# Patient Record
Sex: Female | Born: 1950 | Race: White | Hispanic: No | Marital: Married | Smoking: Current every day smoker
Health system: Southern US, Community
[De-identification: ages and names within clinical notes are randomized; demographics above are authoritative.]

## PROBLEM LIST (undated history)

## (undated) DIAGNOSIS — M1990 Unspecified osteoarthritis, unspecified site: Secondary | ICD-10-CM

## (undated) DIAGNOSIS — C801 Malignant (primary) neoplasm, unspecified: Secondary | ICD-10-CM

## (undated) DIAGNOSIS — R05 Cough: Secondary | ICD-10-CM

## (undated) DIAGNOSIS — R059 Cough, unspecified: Secondary | ICD-10-CM

## (undated) HISTORY — PX: EYE SURGERY: SHX253

## (undated) HISTORY — PX: APPENDECTOMY: SHX54

## (undated) HISTORY — PX: TONSILLECTOMY: SUR1361

## (undated) HISTORY — PX: OTHER SURGICAL HISTORY: SHX169

---

## 2000-07-08 ENCOUNTER — Other Ambulatory Visit: Admission: RE | Admit: 2000-07-08 | Discharge: 2000-07-08 | Payer: Self-pay | Admitting: Gynecology

## 2002-03-23 ENCOUNTER — Other Ambulatory Visit: Admission: RE | Admit: 2002-03-23 | Discharge: 2002-03-23 | Payer: Self-pay | Admitting: Gynecology

## 2003-05-17 ENCOUNTER — Other Ambulatory Visit: Admission: RE | Admit: 2003-05-17 | Discharge: 2003-05-17 | Payer: Self-pay | Admitting: Gynecology

## 2004-06-22 ENCOUNTER — Other Ambulatory Visit: Admission: RE | Admit: 2004-06-22 | Discharge: 2004-06-22 | Payer: Self-pay | Admitting: Gynecology

## 2004-07-25 IMAGING — CR DG CHEST 2V
2 series · 2 of 2 positions shown · non-contrast
Comparison: none

CLINICAL DATA: Post-menopausal bleeding.  Pre-op respiratory exam. 

 CHEST (TWO VIEWS):
 The heart size and mediastinal contours are normal. The lungs are clear. The visualized skeleton is unremarkable.

[view not recorded (1 of 2)]
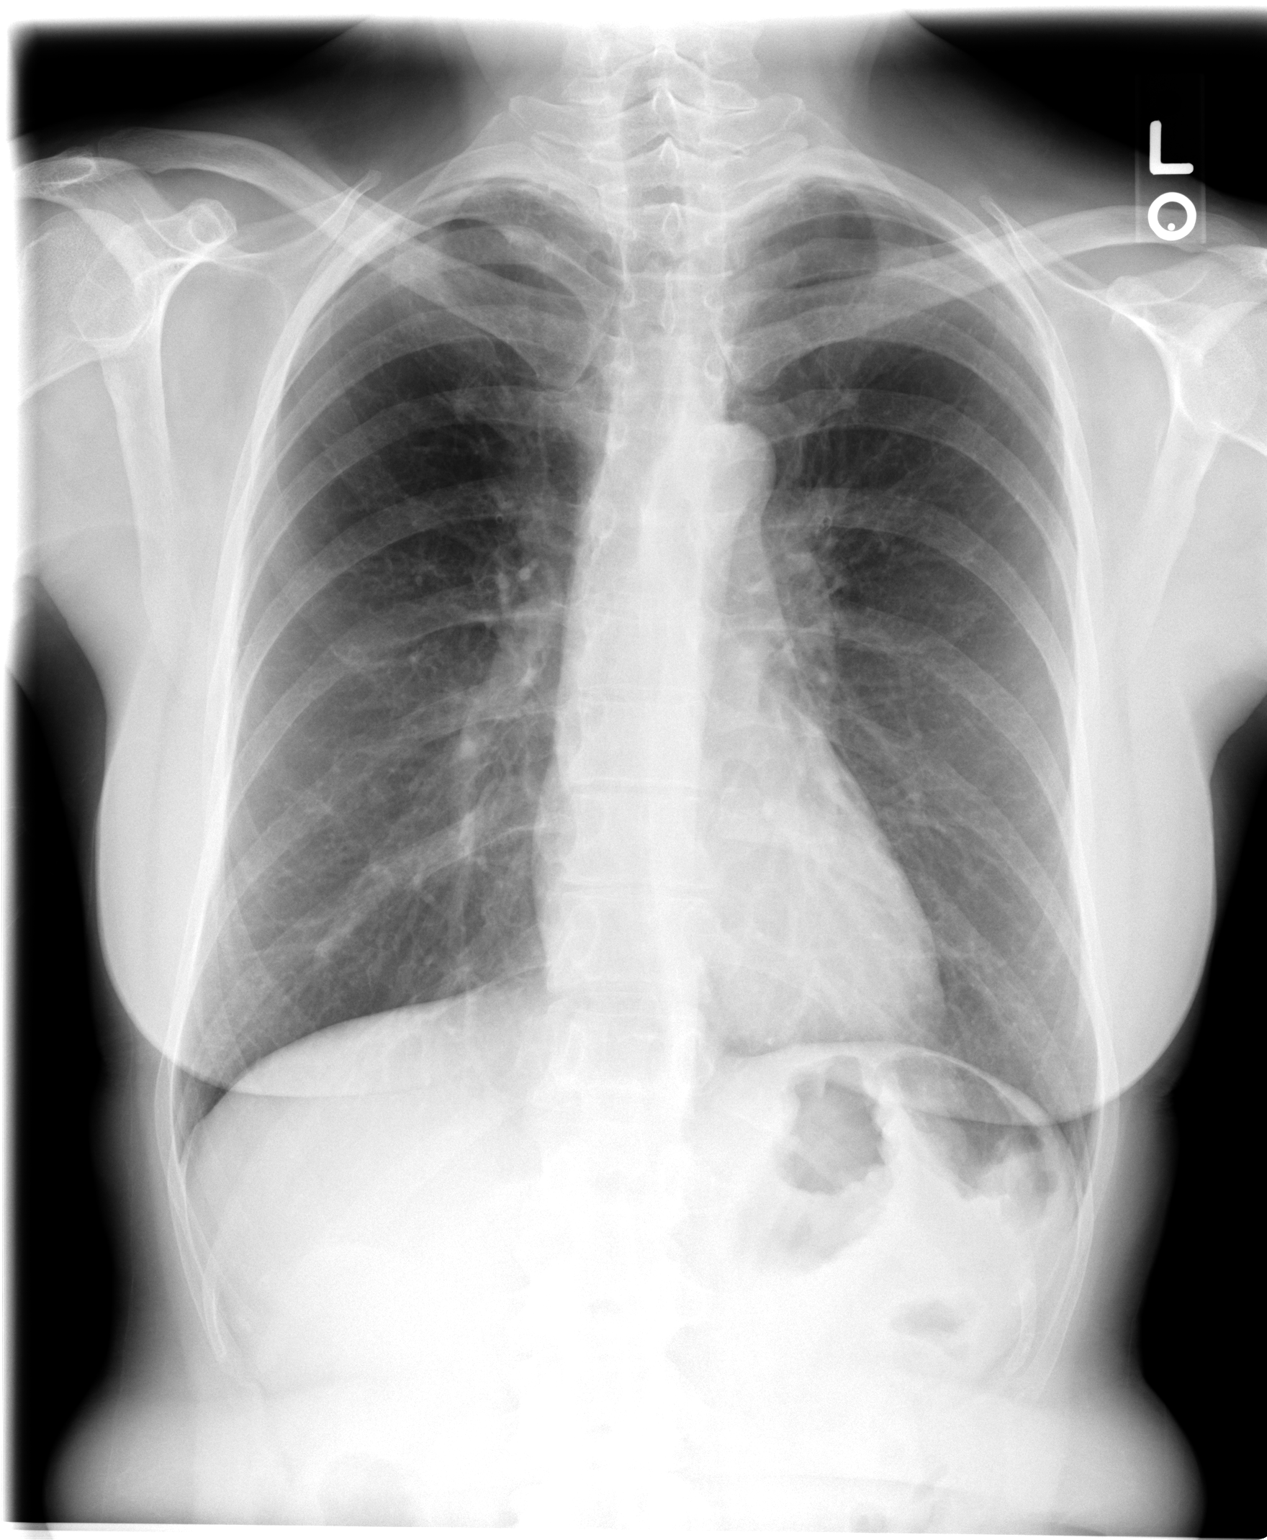

[view not recorded (2 of 2)]
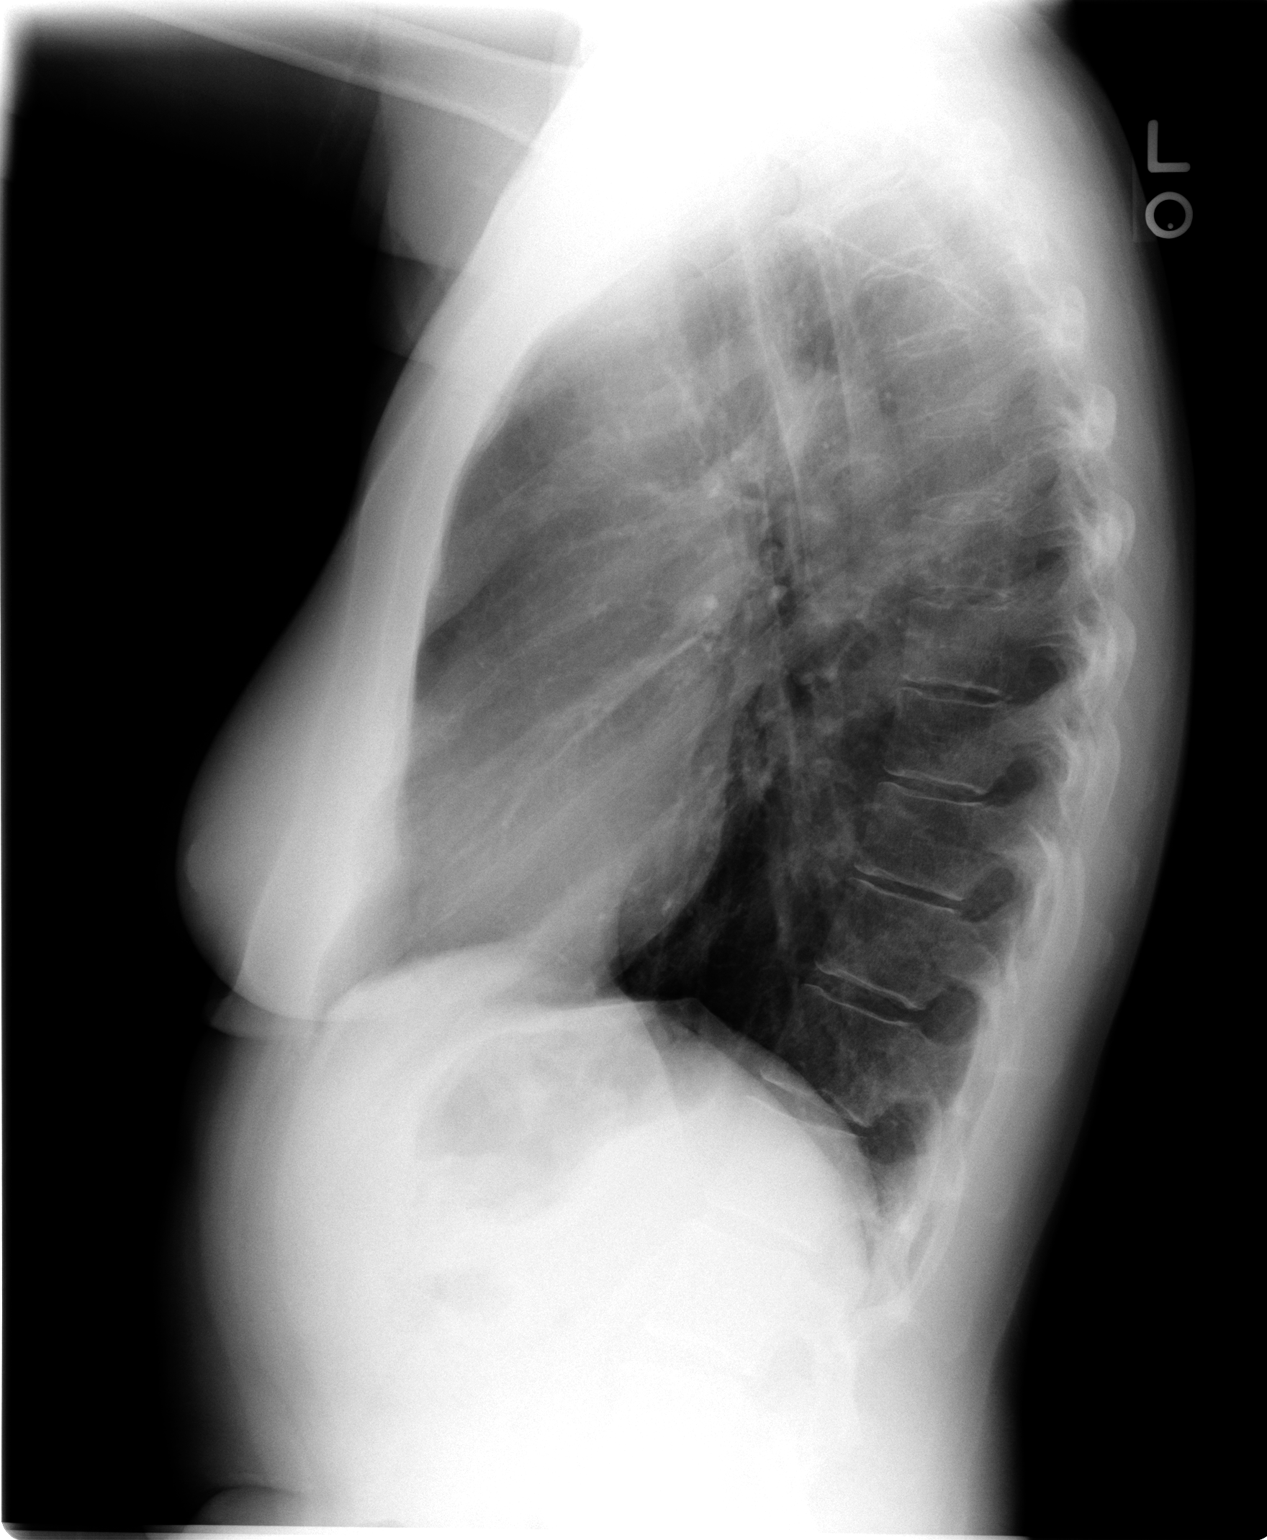

[2 of 2 positions shown; findings below may reference images not displayed]

IMPRESSION: No active disease.

## 2004-07-27 ENCOUNTER — Encounter (INDEPENDENT_AMBULATORY_CARE_PROVIDER_SITE_OTHER): Payer: Self-pay | Admitting: Specialist

## 2004-07-27 ENCOUNTER — Ambulatory Visit (HOSPITAL_COMMUNITY): Admission: RE | Admit: 2004-07-27 | Discharge: 2004-07-27 | Payer: Self-pay | Admitting: Gynecology

## 2005-07-08 ENCOUNTER — Other Ambulatory Visit: Admission: RE | Admit: 2005-07-08 | Discharge: 2005-07-08 | Payer: Self-pay | Admitting: Gynecology

## 2006-01-07 ENCOUNTER — Ambulatory Visit (HOSPITAL_BASED_OUTPATIENT_CLINIC_OR_DEPARTMENT_OTHER): Admission: RE | Admit: 2006-01-07 | Discharge: 2006-01-07 | Payer: Self-pay | Admitting: Orthopedic Surgery

## 2010-02-20 ENCOUNTER — Encounter: Admission: RE | Admit: 2010-02-20 | Discharge: 2010-02-20 | Payer: Self-pay | Admitting: Gynecology

## 2011-01-11 NOTE — Op Note (Signed)
[name], Deborah Zamora               ACCOUNT NO.:  [id number]   MEDICAL RECORD NO.:  [id number]          PATIENT TYPE:  AMB   LOCATION:  SDC                           FACILITY:  [facility]   PHYSICIAN:  Luvenia Redden, M.D.   DATE OF BIRTH:  1951/05/10   DATE OF PROCEDURE:  07/27/2004  DATE OF DISCHARGE:                                 OPERATIVE REPORT   PREOPERATIVE DIAGNOSES:  Postmenopausal bleeding.   POSTOPERATIVE DIAGNOSES:  Postmenopausal bleeding.  Endometrial polyp.   SURGEON:  Luvenia Redden, M.D.   OPERATION:  Hysteroscopy and D&C.   DESCRIPTION OF PROCEDURE:  Under good anesthesia, the patient was prepped  and draped in a sterile manner. The bladder was catheterized with a red  rubber catheter which was then removed.  The uterus was anterior, normal in  size and there are no adnexal masses to be palpated.  The speculum was  placed in the vagina, cervix grasped with a tenaculum, the uterus sounded to  a depth of 7 cm.  The cervix was dilated, hysteroscope placed in the  cervical canal. Using sorbitol as a distending medium, the cervical canal  was inspected and the scope was advanced.  An endometrial polyp was seen  just upon entering the endometrial cavity.  Both ostia were viewed other  than a single polyp, no other pathology was seen. 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  However the surface of the  endometrial cavity except for the polyp appeared to be smooth and pink and  without lesions.  The scope was retracted and removed, endocervical  curettage was done and this was sent as a separate specimen.  Polyp forceps  were used and some polypoid tissue was obtained. The endometrial cavity was  scraped thoroughly and a small amount of tissue was obtained.  The  endometrial cavity was wiped with a dry sponge and then the procedure was  terminated.  Estimated blood loss was 25 mL, none was replaced.  Fluid  deficit was 20 mL.  The patient  tolerated the procedure well and she was  removed to the recovery room in good condition.      WSB/MEDQ  D:  07/27/2004  T:  07/27/2004  Job:  [id number]

## 2011-01-11 NOTE — Op Note (Signed)
NAMEGERYL, DOHN               ACCOUNT NO.:  [id number]   MEDICAL RECORD NO.:  [id number]          PATIENT TYPE:  AMB   LOCATION:  DSC                          FACILITY:  [facility]   PHYSICIAN:  Leonides Grills, M.D.     DATE OF BIRTH:  [birth year]   DATE OF PROCEDURE:  01/07/2006  DATE OF DISCHARGE:                                 OPERATIVE REPORT   PREOPERATIVE DIAGNOSIS:  Left base of fifth metatarsal fracture, zone 1.   POSTOPERATIVE DIAGNOSIS:  Left base of fifth metatarsal fracture, zone 1.   PROCEDURE:  1.  Open reduction and internal fixation left base of fifth metatarsal      fracture.  2.  Stress x-ray of the left foot.   ANESTHESIA:  General.   SURGEON:  Leonides Grills, M.D.   ASSISTANT:  Lianne Cure, P.A.   ESTIMATED BLOOD LOSS:  Minimal.   TOURNIQUET TIME:  Approximately half hour.   COMPLICATIONS:  None.   DISPOSITION:  Stable to the PR.   INDICATIONS FOR PROCEDURE:  This is a 60 year old female who was treated  conservatively initially for about a month or so for the above injury.  It  was found on physical examination that she was still exquisitely tender over  this area and x-rays showed no true evidence of healing and there was  significant displacement.  She is also a smoker as well and continued to  smoke during the conservative management state.  She was consented for the  above procedure.  All risks which include infection, nerve vessel injury  especially sural nerve, nonunion, malunion, hardware rotation, hardware  failure, stiffness, arthritis, tendinitis of the peroneus brevis tendon and  possible refracture and failure of fixation were all explained.  She was  also encouraged to not smoke.  Questions were encouraged and answered.   DESCRIPTION OF PROCEDURE:  The patient was brought to the operating room and  placed in the supine position.  After adequate general anesthesia was  administered with block as well as Ancef 1 gram IV piggyback, a bump  was  placed under the left ipsilateral hip and the left lower extremity was  prepped and draped in a sterile manner over proximal placed thigh  tourniquet.  The limb was gravity exsanguinated and the tourniquet was  elevated to 290 mmHg.  A longitudinal incision over the left fifth  metatarsal was then made and dissection was carried down through skin and  hemostasis was obtained.  Sural nerve branch was identified and retracted  out of harm's way.  A dorsolateral approach was then taken to the fracture  site and under C-arm guidance this was identified.  There was fibrous tissue  and this was debrided.  We then anatomically reduced the joint via a  capsulotomy and direct visualization.  With a two-point reduction clamp,  this was anatomically reduced.  The lateral fragment was soft and we then  fixed this with a 2.0 mm fully threaded cortical set screw using 1.5 mm  drill hole respectively.  This gave excellent purchase and maintenance of  the reduction.  Final stress x-rays  were obtained in three views of the left  foot show no gross motion across the fracture site.  Fixation and proper  position as well.  The joint and area were copiously irrigated with normal  saline.  Bone obtained from the drill bits locally were reapplied as local  bone graft to this area.  Tourniquet was deflated.  Hemostasis was obtained.  Skin was closed with 4-0 nylon suture.  Sterile dressing was applied.  Modified Jones dressing was applied.  The patient was stable to the PR.      Leonides Grills, M.D.  Electronically Signed     PB/MEDQ  D:  01/07/2006  T:  01/08/2006  Job:  [id number]

## 2011-10-10 DIAGNOSIS — H40019 Open angle with borderline findings, low risk, unspecified eye: Secondary | ICD-10-CM | POA: Diagnosis not present

## 2011-10-10 DIAGNOSIS — H2510 Age-related nuclear cataract, unspecified eye: Secondary | ICD-10-CM | POA: Diagnosis not present

## 2011-11-06 DIAGNOSIS — H2510 Age-related nuclear cataract, unspecified eye: Secondary | ICD-10-CM | POA: Diagnosis not present

## 2011-11-12 DIAGNOSIS — Z859 Personal history of malignant neoplasm, unspecified: Secondary | ICD-10-CM | POA: Diagnosis not present

## 2011-11-12 DIAGNOSIS — Z880 Allergy status to penicillin: Secondary | ICD-10-CM | POA: Diagnosis not present

## 2011-11-12 DIAGNOSIS — Z9089 Acquired absence of other organs: Secondary | ICD-10-CM | POA: Diagnosis not present

## 2011-11-12 DIAGNOSIS — Z79899 Other long term (current) drug therapy: Secondary | ICD-10-CM | POA: Diagnosis not present

## 2011-11-12 DIAGNOSIS — H2510 Age-related nuclear cataract, unspecified eye: Secondary | ICD-10-CM | POA: Diagnosis not present

## 2011-11-26 DIAGNOSIS — Z0000 Encounter for general adult medical examination without abnormal findings: Secondary | ICD-10-CM | POA: Diagnosis not present

## 2011-11-26 DIAGNOSIS — Z131 Encounter for screening for diabetes mellitus: Secondary | ICD-10-CM | POA: Diagnosis not present

## 2011-11-26 DIAGNOSIS — IMO0002 Reserved for concepts with insufficient information to code with codable children: Secondary | ICD-10-CM | POA: Diagnosis not present

## 2011-11-26 DIAGNOSIS — F17200 Nicotine dependence, unspecified, uncomplicated: Secondary | ICD-10-CM | POA: Diagnosis not present

## 2011-11-26 DIAGNOSIS — Z136 Encounter for screening for cardiovascular disorders: Secondary | ICD-10-CM | POA: Diagnosis not present

## 2011-11-26 DIAGNOSIS — F341 Dysthymic disorder: Secondary | ICD-10-CM | POA: Diagnosis not present

## 2011-12-05 DIAGNOSIS — H2510 Age-related nuclear cataract, unspecified eye: Secondary | ICD-10-CM | POA: Diagnosis not present

## 2011-12-24 DIAGNOSIS — Z9849 Cataract extraction status, unspecified eye: Secondary | ICD-10-CM | POA: Diagnosis not present

## 2011-12-24 DIAGNOSIS — Z9089 Acquired absence of other organs: Secondary | ICD-10-CM | POA: Diagnosis not present

## 2011-12-24 DIAGNOSIS — Z880 Allergy status to penicillin: Secondary | ICD-10-CM | POA: Diagnosis not present

## 2011-12-24 DIAGNOSIS — Z79899 Other long term (current) drug therapy: Secondary | ICD-10-CM | POA: Diagnosis not present

## 2011-12-24 DIAGNOSIS — H2510 Age-related nuclear cataract, unspecified eye: Secondary | ICD-10-CM | POA: Diagnosis not present

## 2011-12-24 DIAGNOSIS — Z859 Personal history of malignant neoplasm, unspecified: Secondary | ICD-10-CM | POA: Diagnosis not present

## 2012-01-10 DIAGNOSIS — G609 Hereditary and idiopathic neuropathy, unspecified: Secondary | ICD-10-CM | POA: Diagnosis not present

## 2012-01-10 DIAGNOSIS — L989 Disorder of the skin and subcutaneous tissue, unspecified: Secondary | ICD-10-CM | POA: Diagnosis not present

## 2012-02-20 DIAGNOSIS — Z1231 Encounter for screening mammogram for malignant neoplasm of breast: Secondary | ICD-10-CM | POA: Diagnosis not present

## 2012-02-20 DIAGNOSIS — Z124 Encounter for screening for malignant neoplasm of cervix: Secondary | ICD-10-CM | POA: Diagnosis not present

## 2012-02-20 DIAGNOSIS — Z1212 Encounter for screening for malignant neoplasm of rectum: Secondary | ICD-10-CM | POA: Diagnosis not present

## 2012-03-12 DIAGNOSIS — I789 Disease of capillaries, unspecified: Secondary | ICD-10-CM | POA: Diagnosis not present

## 2012-03-12 DIAGNOSIS — Z85828 Personal history of other malignant neoplasm of skin: Secondary | ICD-10-CM | POA: Diagnosis not present

## 2012-03-12 DIAGNOSIS — Z809 Family history of malignant neoplasm, unspecified: Secondary | ICD-10-CM | POA: Diagnosis not present

## 2012-03-12 DIAGNOSIS — L570 Actinic keratosis: Secondary | ICD-10-CM | POA: Diagnosis not present

## 2012-04-29 DIAGNOSIS — L570 Actinic keratosis: Secondary | ICD-10-CM | POA: Diagnosis not present

## 2012-04-29 DIAGNOSIS — C44721 Squamous cell carcinoma of skin of unspecified lower limb, including hip: Secondary | ICD-10-CM | POA: Diagnosis not present

## 2012-04-29 DIAGNOSIS — L821 Other seborrheic keratosis: Secondary | ICD-10-CM | POA: Diagnosis not present

## 2012-04-29 DIAGNOSIS — D485 Neoplasm of uncertain behavior of skin: Secondary | ICD-10-CM | POA: Diagnosis not present

## 2012-06-10 DIAGNOSIS — L905 Scar conditions and fibrosis of skin: Secondary | ICD-10-CM | POA: Diagnosis not present

## 2012-06-10 DIAGNOSIS — C44721 Squamous cell carcinoma of skin of unspecified lower limb, including hip: Secondary | ICD-10-CM | POA: Diagnosis not present

## 2012-07-20 DIAGNOSIS — H40019 Open angle with borderline findings, low risk, unspecified eye: Secondary | ICD-10-CM | POA: Diagnosis not present

## 2013-07-02 DIAGNOSIS — Z961 Presence of intraocular lens: Secondary | ICD-10-CM | POA: Diagnosis not present

## 2013-07-30 DIAGNOSIS — Z23 Encounter for immunization: Secondary | ICD-10-CM | POA: Diagnosis not present

## 2014-02-14 DIAGNOSIS — F17200 Nicotine dependence, unspecified, uncomplicated: Secondary | ICD-10-CM | POA: Diagnosis not present

## 2014-02-14 DIAGNOSIS — Z0000 Encounter for general adult medical examination without abnormal findings: Secondary | ICD-10-CM | POA: Diagnosis not present

## 2014-02-14 DIAGNOSIS — B009 Herpesviral infection, unspecified: Secondary | ICD-10-CM | POA: Diagnosis not present

## 2014-03-24 ENCOUNTER — Other Ambulatory Visit: Payer: Self-pay | Admitting: Gynecology

## 2014-03-24 DIAGNOSIS — Z124 Encounter for screening for malignant neoplasm of cervix: Secondary | ICD-10-CM | POA: Diagnosis not present

## 2014-03-24 DIAGNOSIS — Z1212 Encounter for screening for malignant neoplasm of rectum: Secondary | ICD-10-CM | POA: Diagnosis not present

## 2014-03-24 DIAGNOSIS — Z1231 Encounter for screening mammogram for malignant neoplasm of breast: Secondary | ICD-10-CM | POA: Diagnosis not present

## 2014-03-25 LAB — CYTOLOGY - PAP

## 2014-04-04 ENCOUNTER — Other Ambulatory Visit: Payer: Self-pay | Admitting: Gastroenterology

## 2014-04-04 DIAGNOSIS — Z1211 Encounter for screening for malignant neoplasm of colon: Secondary | ICD-10-CM | POA: Diagnosis not present

## 2014-04-04 DIAGNOSIS — D126 Benign neoplasm of colon, unspecified: Secondary | ICD-10-CM | POA: Diagnosis not present

## 2014-04-04 DIAGNOSIS — K5730 Diverticulosis of large intestine without perforation or abscess without bleeding: Secondary | ICD-10-CM | POA: Diagnosis not present

## 2014-05-03 ENCOUNTER — Ambulatory Visit (INDEPENDENT_AMBULATORY_CARE_PROVIDER_SITE_OTHER): Payer: Medicare Other | Admitting: General Surgery

## 2014-05-03 ENCOUNTER — Other Ambulatory Visit (INDEPENDENT_AMBULATORY_CARE_PROVIDER_SITE_OTHER): Payer: Self-pay | Admitting: General Surgery

## 2014-05-03 DIAGNOSIS — D126 Benign neoplasm of colon, unspecified: Secondary | ICD-10-CM | POA: Diagnosis not present

## 2014-05-12 DIAGNOSIS — D126 Benign neoplasm of colon, unspecified: Secondary | ICD-10-CM | POA: Diagnosis not present

## 2014-05-23 ENCOUNTER — Other Ambulatory Visit (INDEPENDENT_AMBULATORY_CARE_PROVIDER_SITE_OTHER): Payer: Self-pay | Admitting: General Surgery

## 2014-05-23 NOTE — H&P (Signed)
Deborah Zamora 05/03/2014 10:00 AM Location: [facility] Patient #: [phone] DOB: [birth year] Married / Language: Deborah Zamora / Race: White Female  History of Present Illness Leighton Ruff MD; 01/25/8314 11:29 AM) Patient words: Pt here for consult of precancerous polyp found on colonoscopy.  The patient is a 63 year old female who presents with a colonic polyp. The patient was referred by a gastroenterologist (Dr Paulita Fujita). Polyp location is in the cecum. Current diagnosis was determined by colonoscopy. Symptoms do not include rectal bleeding, diarrhea, constipation, change in bowel habits or abdominal pain. Pertinent family history does not include colorectal cancer.  Additional reasons for visit:  anal polyp is described as the following: this was found on colonoscopy. 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 Patient denies any symptoms. She denies any bleeding or pain with bowel movements. She has a history of hemorrhoid problems in the past but has not had any symptoms in a long time.  Other Problems Lenoria Farrier; 05/03/2014 10:01 AM) Anxiety Disorder Arthritis Cancer Hemorrhoids Other disease, cancer, significant illness  Past Surgical History Lenoria Farrier; 05/03/2014 10:01 AM) Appendectomy Cataract Surgery Bilateral. Foot Surgery Left. Oral Surgery Tonsillectomy  Diagnostic Studies History Lenoria Farrier; 05/03/2014 10:01 AM) Mammogram within last year Pap Smear 1-5 years ago  Allergies Apolonio Schneiders McMillen; 05/03/2014 10:07 AM) Penicillins  Medication History Apolonio Schneiders McMillen; 05/03/2014 10:11 AM) Vitamin C (1000MG  Tablet, Oral daily) Active. Calcium Carbonate (600MG  Tablet, Oral daily) Active. Magnesium Gluconate (30MG  Tablet, Oral daily) Active. Valtrex (500MG  Tablet, Oral as needed) Active.  Pregnancy / Birth History Lenoria Farrier; 05/03/2014 10:01 AM) Age at menarche 35 years. Age of menopause 67-55 Gravida  1 Maternal age 43-25 Para 0  Review of Systems Lenoria Farrier; 05/03/2014 10:01 AM) General Present- Night Sweats. Not Present- Appetite Loss, Chills, Fatigue, Fever, Weight Gain and Weight Loss. Skin Not Present- Change in Wart/Mole, Dryness, Hives, Jaundice, New Lesions, Non-Healing Wounds, Rash and Ulcer. HEENT Present- Wears glasses/contact lenses. Not Present- Earache, Hearing Loss, Hoarseness, Nose Bleed, Oral Ulcers, Ringing in the Ears, Seasonal Allergies, Sinus Pain, Sore Throat, Visual Disturbances and Yellow Eyes. Respiratory Present- Snoring. Not Present- Bloody sputum, Chronic Cough, Difficulty Breathing and Wheezing. Breast Not Present- Breast Mass, Breast Pain, Nipple Discharge and Skin Changes. Cardiovascular Present- Leg Cramps. Not Present- Chest Pain, Difficulty Breathing Lying Down, Palpitations, Rapid Heart Rate, Shortness of Breath and Swelling of Extremities. Gastrointestinal Not Present- Abdominal Pain, Bloating, Bloody Stool, Change in Bowel Habits, Chronic diarrhea, Constipation, Difficulty Swallowing, Excessive gas, Gets full quickly at meals, Hemorrhoids, Indigestion, Nausea, Rectal Pain and Vomiting. Female Genitourinary Not Present- Frequency, Nocturia, Painful Urination, Pelvic Pain and Urgency. Musculoskeletal Present- Joint Pain and Joint Stiffness. Not Present- Back Pain, Muscle Pain, Muscle Weakness and Swelling of Extremities. Neurological Present- Tingling and Tremor. Not Present- Decreased Memory, Fainting, Headaches, Numbness, Seizures, Trouble walking and Weakness. Psychiatric Not Present- Anxiety, Bipolar, Change in Sleep Pattern, Depression, Fearful and Frequent crying. Endocrine Present- Hot flashes. Not Present- Cold Intolerance, Excessive Hunger, Hair Changes, Heat Intolerance and New Diabetes. Hematology Not Present- Easy Bruising, Excessive bleeding, Gland problems, HIV and Persistent Infections.   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 Impression: We discussed surgical resection of her cecal polyp. This would most likely be an ileocecectomy, unless there was signs of frank cancer on exam. If this was the case, I have recommended a right hemicolectomy. We will also do an anal exam under anesthesia with possible excision of anal polyp depending on how this appears on exam. The surgery and anatomy were described to the patient as well as the risks of surgery and the possible complications. These include: Bleeding, deep abdominal infections and possible wound complications such as hernia and infection, damage to adjacent structures, leak of surgical connections, which can lead to other surgeries and possibly an ostomy, possible need for other procedures, such as abscess drains in radiology, possible prolonged hospital stay, possible diarrhea from removal of part of the colon, possible bladder or sexual dysfunction if having rectal surgery, possible constipation from narcotics, prolonged fatigue/weakness or appetite loss, possible early recurrence of of disease, possible complications of their medical problems such as heart disease or arrhythmias or lung problems, death (less than 1%). I believe the patient understands and wishes to proceed with the surgery. Current Plans  Pt Education - [facility] Bowel Prep Started Flagyl 500MG , 2 (two) Tablet SEE NOTE, #6, 05/03/2014, No Refill. Local Order: Take at 2pm, 3pm, and 10pm the day prior to your colon operation Started Neomycin Sulfate 500MG , 2 (two) Tablet SEE NOTE,  #6, 05/03/2014, No Refill. Local Order: TAKE TWO TABLETS AT 2 PM, 3 PM, AND 10 PM THE DAY PRIOR TO SURGERY   Signed by Leighton Ruff, MD (09/30/971 11:29 AM)

## 2014-05-24 ENCOUNTER — Encounter (HOSPITAL_COMMUNITY): Payer: Self-pay | Admitting: Pharmacy Technician

## 2014-05-27 ENCOUNTER — Other Ambulatory Visit (HOSPITAL_COMMUNITY): Payer: Self-pay | Admitting: Anesthesiology

## 2014-05-30 ENCOUNTER — Encounter (INDEPENDENT_AMBULATORY_CARE_PROVIDER_SITE_OTHER): Payer: Self-pay

## 2014-05-30 ENCOUNTER — Encounter (HOSPITAL_COMMUNITY): Payer: Self-pay

## 2014-05-30 ENCOUNTER — Encounter (HOSPITAL_COMMUNITY)
Admission: RE | Admit: 2014-05-30 | Discharge: 2014-05-30 | Disposition: A | Discharge status: Home / Self Care | Payer: Medicare Other | Source: Ambulatory Visit | Attending: General Surgery | Admitting: General Surgery

## 2014-05-30 DIAGNOSIS — K635 Polyp of colon: Secondary | ICD-10-CM | POA: Diagnosis not present

## 2014-05-30 DIAGNOSIS — Z720 Tobacco use: Secondary | ICD-10-CM | POA: Diagnosis not present

## 2014-05-30 DIAGNOSIS — K620 Anal polyp: Secondary | ICD-10-CM | POA: Diagnosis not present

## 2014-05-30 DIAGNOSIS — Z01812 Encounter for preprocedural laboratory examination: Secondary | ICD-10-CM | POA: Diagnosis not present

## 2014-05-30 DIAGNOSIS — D120 Benign neoplasm of cecum: Secondary | ICD-10-CM | POA: Diagnosis not present

## 2014-05-30 HISTORY — DX: Cough, unspecified: R05.9

## 2014-05-30 HISTORY — DX: Cough: R05

## 2014-05-30 HISTORY — DX: Unspecified osteoarthritis, unspecified site: M19.90

## 2014-05-30 HISTORY — DX: Malignant (primary) neoplasm, unspecified: C80.1

## 2014-05-30 LAB — CBC
HCT: 48.1 % — ABNORMAL HIGH (ref 36.0–46.0)
Hemoglobin: 16.7 g/dL — ABNORMAL HIGH (ref 12.0–15.0)
MCH: 34.5 pg — ABNORMAL HIGH (ref 26.0–34.0)
MCHC: 34.7 g/dL (ref 30.0–36.0)
MCV: 99.4 fL (ref 78.0–100.0)
Platelets: 184 10*3/uL (ref 150–400)
RBC: 4.84 MIL/uL (ref 3.87–5.11)
RDW: 13.8 % (ref 11.5–15.5)
WBC: 9.9 10*3/uL (ref 4.0–10.5)

## 2014-05-30 LAB — HEMOGLOBIN A1C
Hgb A1c MFr Bld: 5.3 % (ref <5.7)
Mean Plasma Glucose: 105 mg/dL (ref <117)

## 2014-05-30 LAB — BASIC METABOLIC PANEL
Anion gap: 13 (ref 5–15)
BUN: 11 mg/dL (ref 6–23)
CO2: 25 mEq/L (ref 19–32)
Calcium: 9.6 mg/dL (ref 8.4–10.5)
Chloride: 102 mEq/L (ref 96–112)
Creatinine, Ser: 0.67 mg/dL (ref 0.50–1.10)
GFR calc Af Amer: >90 mL/min (ref >90)
GFR calc non Af Amer: >90 mL/min (ref >90)
Glucose, Bld: 110 mg/dL — ABNORMAL HIGH (ref 70–99)
Potassium: 4.8 mEq/L (ref 3.7–5.3)
Sodium: 140 mEq/L (ref 137–147)

## 2014-05-30 NOTE — Patient Instructions (Signed)
[place]  05/30/2014   Your procedure is scheduled on: Thursday October 8th, 2015  Report to [facility] Main Entrance and follow signs to  [place] at  900 AM.  Call this number if you have problems the morning of surgery [phone]   Remember: follow all bowel prep instructions from dr Marcello Moores  Do not eat food  :After Midnight Tuesday night, clear liquids all day Wednesday October 7th, 2015, no clear liquids after midnight Wednesday night.     Take these medicines the morning of surgery with A SIP OF WATER:                                You may not have any metal on your body including hair pins and piercings  Do not wear jewelry, make-up, lotions, powders, or deodorant.   Men may shave face and neck.  Do not bring valuables to the hospital. Many.  Contacts, dentures or bridgework may not be worn into surgery.  Leave suitcase in the car. After surgery it may be brought to your room.  For patients admitted to the hospital, checkout time is 11:00 AM the day of discharge.   Patients discharged the day of surgery will not be allowed to drive home.  Name and phone number of your driver:  Special Instructions: N/A ________________________________________________________________________  [facility] - Preparing for Surgery Before surgery, you can play an important role.  Because skin is not sterile, your skin needs to be as free of germs as possible.  You can reduce the number of germs on your skin by washing with CHG (chlorahexidine gluconate) soap before surgery.  CHG is an antiseptic cleaner which kills germs and bonds with the skin to continue killing germs even after washing. Please DO NOT use if you have an allergy to CHG or antibacterial soaps.  If your skin becomes reddened/irritated stop using the CHG and inform your nurse when you arrive at Short Stay. Do not shave (including legs and underarms) for at least 48 hours  prior to the first CHG shower.  You may shave your face/neck. Please follow these instructions carefully:  1.  Shower with CHG Soap the night before surgery and the  morning of Surgery.  2.  If you choose to wash your hair, wash your hair first as usual with your  normal  shampoo.  3.  After you shampoo, rinse your hair and body thoroughly to remove the  shampoo.                           4.  Use CHG as you would any other liquid soap.  You can apply chg directly  to the skin and wash                       Gently with a scrungie or clean washcloth.  5.  Apply the CHG Soap to your body ONLY FROM THE NECK DOWN.   Do not use on face/ open                           Wound or open sores. Avoid contact with eyes, ears mouth and genitals (private parts).  Wash face,  Genitals (private parts) with your normal soap.             6.  Wash thoroughly, paying special attention to the area where your surgery  will be performed.  7.  Thoroughly rinse your body with warm water from the neck down.  8.  DO NOT shower/wash with your normal soap after using and rinsing off  the CHG Soap.                9.  Pat yourself dry with a clean towel.            10.  Wear clean pajamas.            11.  Place clean sheets on your bed the night of your first shower and do not  sleep with pets. Day of Surgery : Do not apply any lotions/deodorants the morning of surgery.  Please wear clean clothes to the hospital/surgery center.  FAILURE TO FOLLOW THESE INSTRUCTIONS MAY RESULT IN THE CANCELLATION OF YOUR SURGERY PATIENT SIGNATURE_________________________________  NURSE SIGNATURE__________________________________  ________________________________________________________________________    CLEAR LIQUID DIET   Foods Allowed                                                                     Foods Excluded  Coffee and tea, regular and decaf                             liquids that you cannot  Plain  Jell-O in any flavor                                             see through such as: Fruit ices (not with fruit pulp)                                     milk, soups, orange juice  Iced Popsicles                                    All solid food Carbonated beverages, regular and diet                                    Cranberry, grape and apple juices Sports drinks like Gatorade Lightly seasoned clear broth or consume(fat free) Sugar, honey syrup  Sample Menu Breakfast                                Lunch                                     Supper Cranberry juice  Beef broth                            Chicken broth Jell-O                                     Grape juice                           Apple juice Coffee or tea                        Jell-O                                      Popsicle                                                Coffee or tea                        Coffee or tea  _____________________________________________________________________

## 2014-06-02 ENCOUNTER — Inpatient Hospital Stay (HOSPITAL_COMMUNITY)
Admission: RE | Admit: 2014-06-02 | Discharge: 2014-06-05 | DRG: 331 | Disposition: A | Discharge status: Home / Self Care | Payer: Medicare Other | Source: Ambulatory Visit | Attending: General Surgery | Admitting: General Surgery

## 2014-06-02 ENCOUNTER — Encounter (HOSPITAL_COMMUNITY): Payer: Medicare Other | Admitting: Anesthesiology

## 2014-06-02 ENCOUNTER — Encounter (HOSPITAL_COMMUNITY): Payer: Self-pay | Admitting: *Deleted

## 2014-06-02 ENCOUNTER — Encounter (HOSPITAL_COMMUNITY)
Admission: RE | Disposition: A | Discharge status: Home / Self Care | Payer: Self-pay | Source: Ambulatory Visit | Attending: General Surgery

## 2014-06-02 ENCOUNTER — Ambulatory Visit (HOSPITAL_COMMUNITY): Payer: Medicare Other | Admitting: Anesthesiology

## 2014-06-02 DIAGNOSIS — Z720 Tobacco use: Secondary | ICD-10-CM | POA: Diagnosis not present

## 2014-06-02 DIAGNOSIS — K620 Anal polyp: Secondary | ICD-10-CM | POA: Diagnosis not present

## 2014-06-02 DIAGNOSIS — Z01812 Encounter for preprocedural laboratory examination: Secondary | ICD-10-CM | POA: Diagnosis not present

## 2014-06-02 DIAGNOSIS — D120 Benign neoplasm of cecum: Principal | ICD-10-CM | POA: Diagnosis present

## 2014-06-02 DIAGNOSIS — K635 Polyp of colon: Secondary | ICD-10-CM | POA: Diagnosis not present

## 2014-06-02 DIAGNOSIS — F17200 Nicotine dependence, unspecified, uncomplicated: Secondary | ICD-10-CM | POA: Diagnosis present

## 2014-06-02 HISTORY — PX: RECTAL EXAM UNDER ANESTHESIA: SHX6399

## 2014-06-02 HISTORY — PX: LAPAROSCOPIC PARTIAL COLECTOMY: SHX5907

## 2014-06-02 LAB — TYPE AND SCREEN
ABO/RH(D): O POS
Antibody Screen: NEGATIVE

## 2014-06-02 LAB — ABO/RH: ABO/RH(D): O POS

## 2014-06-02 SURGERY — LAPAROSCOPIC PARTIAL COLECTOMY
Anesthesia: General | Site: Rectum

## 2014-06-02 MED ORDER — GLYCOPYRROLATE 0.2 MG/ML IJ SOLN
INTRAMUSCULAR | Status: DC | PRN
  Administered 2014-06-02: 0.4 mg via INTRAVENOUS

## 2014-06-02 MED ORDER — GLYCOPYRROLATE 0.2 MG/ML IJ SOLN
INTRAMUSCULAR | Status: AC
  Filled 2014-06-02: qty 2

## 2014-06-02 MED ORDER — STERILE WATER FOR IRRIGATION IR SOLN
Status: DC | PRN
  Administered 2014-06-02: 1500 mL

## 2014-06-02 MED ORDER — HYDROMORPHONE HCL 1 MG/ML IJ SOLN
INTRAMUSCULAR | Status: AC
  Filled 2014-06-02: qty 1

## 2014-06-02 MED ORDER — PROMETHAZINE HCL 25 MG/ML IJ SOLN: 6.2500 mg | INTRAMUSCULAR | Status: DC | PRN

## 2014-06-02 MED ORDER — ACETAMINOPHEN 500 MG PO TABS
1000.0000 mg | ORAL_TABLET | Freq: Four times a day (QID) | ORAL | Status: AC
  Administered 2014-06-03 (×2): 1000 mg via ORAL
  Filled 2014-06-02 (×6): qty 2

## 2014-06-02 MED ORDER — ROCURONIUM BROMIDE 100 MG/10ML IV SOLN
INTRAVENOUS | Status: DC | PRN
  Administered 2014-06-02 (×2): 10 mg via INTRAVENOUS
  Administered 2014-06-02: 20 mg via INTRAVENOUS

## 2014-06-02 MED ORDER — CEFOTETAN DISODIUM-DEXTROSE 2-2.08 GM-% IV SOLR
INTRAVENOUS | Status: AC
  Filled 2014-06-02: qty 50

## 2014-06-02 MED ORDER — LACTATED RINGERS IR SOLN
Status: DC | PRN
  Administered 2014-06-02: 1000 mL

## 2014-06-02 MED ORDER — FENTANYL CITRATE 0.05 MG/ML IJ SOLN
INTRAMUSCULAR | Status: AC
  Filled 2014-06-02: qty 5

## 2014-06-02 MED ORDER — PROPOFOL 10 MG/ML IV BOLUS
INTRAVENOUS | Status: DC | PRN
  Administered 2014-06-02: 110 mg via INTRAVENOUS

## 2014-06-02 MED ORDER — MIDAZOLAM HCL 2 MG/2ML IJ SOLN
INTRAMUSCULAR | Status: AC
  Filled 2014-06-02: qty 2

## 2014-06-02 MED ORDER — ONDANSETRON HCL 4 MG PO TABS: 4.0000 mg | ORAL_TABLET | Freq: Four times a day (QID) | ORAL | Status: DC | PRN

## 2014-06-02 MED ORDER — NEOSTIGMINE METHYLSULFATE 10 MG/10ML IV SOLN
INTRAVENOUS | Status: AC
  Filled 2014-06-02: qty 1

## 2014-06-02 MED ORDER — LACTATED RINGERS IV SOLN
INTRAVENOUS | Status: DC
  Administered 2014-06-02 (×3): via INTRAVENOUS

## 2014-06-02 MED ORDER — DIPHENHYDRAMINE HCL 50 MG/ML IJ SOLN: 12.5000 mg | Freq: Four times a day (QID) | INTRAMUSCULAR | Status: DC | PRN

## 2014-06-02 MED ORDER — CEFOTETAN DISODIUM 2 G IJ SOLR
2.0000 g | INTRAMUSCULAR | Status: AC
  Administered 2014-06-02: 2 g via INTRAVENOUS
  Filled 2014-06-02: qty 2

## 2014-06-02 MED ORDER — BUPIVACAINE-EPINEPHRINE (PF) 0.25% -1:200000 IJ SOLN
INTRAMUSCULAR | Status: AC
  Filled 2014-06-02: qty 30

## 2014-06-02 MED ORDER — PROPOFOL 10 MG/ML IV BOLUS
INTRAVENOUS | Status: AC
  Filled 2014-06-02: qty 20

## 2014-06-02 MED ORDER — DIPHENHYDRAMINE HCL 12.5 MG/5ML PO ELIX: 12.5000 mg | ORAL_SOLUTION | Freq: Four times a day (QID) | ORAL | Status: DC | PRN

## 2014-06-02 MED ORDER — SUCCINYLCHOLINE CHLORIDE 20 MG/ML IJ SOLN
INTRAMUSCULAR | Status: DC | PRN
  Administered 2014-06-02: 100 mg via INTRAVENOUS

## 2014-06-02 MED ORDER — 0.9 % SODIUM CHLORIDE (POUR BTL) OPTIME
TOPICAL | Status: DC | PRN
  Administered 2014-06-02: 2000 mL

## 2014-06-02 MED ORDER — MIDAZOLAM HCL 5 MG/5ML IJ SOLN
INTRAMUSCULAR | Status: DC | PRN
  Administered 2014-06-02 (×2): 1 mg via INTRAVENOUS

## 2014-06-02 MED ORDER — ENOXAPARIN SODIUM 40 MG/0.4ML ~~LOC~~ SOLN
40.0000 mg | SUBCUTANEOUS | Status: DC
  Administered 2014-06-03 – 2014-06-04 (×2): 40 mg via SUBCUTANEOUS
  Filled 2014-06-02 (×4): qty 0.4

## 2014-06-02 MED ORDER — HYDROMORPHONE HCL 1 MG/ML IJ SOLN
0.2500 mg | INTRAMUSCULAR | Status: DC | PRN
  Administered 2014-06-02 (×2): 0.25 mg via INTRAVENOUS
  Administered 2014-06-02 (×2): 0.5 mg via INTRAVENOUS
  Administered 2014-06-02 (×2): 0.25 mg via INTRAVENOUS

## 2014-06-02 MED ORDER — HYDROCODONE-ACETAMINOPHEN 5-325 MG PO TABS
1.0000 | ORAL_TABLET | ORAL | Status: DC | PRN
  Administered 2014-06-03: 1 via ORAL
  Filled 2014-06-02: qty 1

## 2014-06-02 MED ORDER — BUPIVACAINE-EPINEPHRINE 0.25% -1:200000 IJ SOLN
INTRAMUSCULAR | Status: DC | PRN
  Administered 2014-06-02: 20 mL

## 2014-06-02 MED ORDER — FENTANYL CITRATE 0.05 MG/ML IJ SOLN
INTRAMUSCULAR | Status: DC | PRN
  Administered 2014-06-02: 150 ug via INTRAVENOUS
  Administered 2014-06-02: 100 ug via INTRAVENOUS

## 2014-06-02 MED ORDER — ONDANSETRON HCL 4 MG/2ML IJ SOLN: 4.0000 mg | Freq: Four times a day (QID) | INTRAMUSCULAR | Status: DC | PRN

## 2014-06-02 MED ORDER — ONDANSETRON HCL 4 MG/2ML IJ SOLN
INTRAMUSCULAR | Status: AC
  Filled 2014-06-02: qty 2

## 2014-06-02 MED ORDER — PHENYLEPHRINE 40 MCG/ML (10ML) SYRINGE FOR IV PUSH (FOR BLOOD PRESSURE SUPPORT)
PREFILLED_SYRINGE | INTRAVENOUS | Status: AC
  Filled 2014-06-02: qty 10

## 2014-06-02 MED ORDER — PHENYLEPHRINE HCL 10 MG/ML IJ SOLN
INTRAMUSCULAR | Status: DC | PRN
  Administered 2014-06-02: 80 ug via INTRAVENOUS

## 2014-06-02 MED ORDER — ONDANSETRON HCL 4 MG/2ML IJ SOLN
INTRAMUSCULAR | Status: DC | PRN
  Administered 2014-06-02: 4 mg via INTRAVENOUS

## 2014-06-02 MED ORDER — DEXAMETHASONE SODIUM PHOSPHATE 10 MG/ML IJ SOLN
INTRAMUSCULAR | Status: AC
  Filled 2014-06-02: qty 1

## 2014-06-02 MED ORDER — NEOSTIGMINE METHYLSULFATE 10 MG/10ML IV SOLN
INTRAVENOUS | Status: DC | PRN
  Administered 2014-06-02: 3 mg via INTRAVENOUS

## 2014-06-02 MED ORDER — ALVIMOPAN 12 MG PO CAPS
12.0000 mg | ORAL_CAPSULE | Freq: Once | ORAL | Status: AC
  Administered 2014-06-02: 12 mg via ORAL
  Filled 2014-06-02: qty 1

## 2014-06-02 MED ORDER — ALVIMOPAN 12 MG PO CAPS
12.0000 mg | ORAL_CAPSULE | Freq: Two times a day (BID) | ORAL | Status: DC
  Administered 2014-06-03 – 2014-06-04 (×4): 12 mg via ORAL
  Filled 2014-06-02 (×6): qty 1

## 2014-06-02 MED ORDER — DEXAMETHASONE SODIUM PHOSPHATE 10 MG/ML IJ SOLN
INTRAMUSCULAR | Status: DC | PRN
  Administered 2014-06-02: 10 mg via INTRAVENOUS

## 2014-06-02 MED ORDER — MORPHINE SULFATE 2 MG/ML IJ SOLN
2.0000 mg | INTRAMUSCULAR | Status: DC | PRN
  Filled 2014-06-02: qty 1

## 2014-06-02 SURGICAL SUPPLY — 84 items
APPLIER CLIP 5 13 M/L LIGAMAX5 (MISCELLANEOUS)
BLADE EXTENDED COATED 6.5IN (ELECTRODE) ×4 IMPLANT
BLADE SURG SZ10 CARB STEEL (BLADE) ×4 IMPLANT
CABLE HIGH FREQUENCY MONO STRZ (ELECTRODE) IMPLANT
CELLS DAT CNTRL 66122 CELL SVR (MISCELLANEOUS) ×2 IMPLANT
CHLORAPREP W/TINT 26ML (MISCELLANEOUS) ×4 IMPLANT
CLIP APPLIE 5 13 M/L LIGAMAX5 (MISCELLANEOUS) IMPLANT
COVER MAYO STAND STRL (DRAPES) ×8 IMPLANT
DECANTER SPIKE VIAL GLASS SM (MISCELLANEOUS) IMPLANT
DERMABOND ADVANCED (GAUZE/BANDAGES/DRESSINGS) ×2
DERMABOND ADVANCED .7 DNX12 (GAUZE/BANDAGES/DRESSINGS) ×2 IMPLANT
DRAIN CHANNEL 19F RND (DRAIN) IMPLANT
DRAPE LAPAROSCOPIC ABDOMINAL (DRAPES) ×4 IMPLANT
DRAPE SHEET LG 3/4 BI-LAMINATE (DRAPES) ×4 IMPLANT
DRAPE UTILITY XL STRL (DRAPES) ×4 IMPLANT
DRAPE WARM FLUID 44X44 (DRAPE) ×8 IMPLANT
DRSG OPSITE POSTOP 3X4 (GAUZE/BANDAGES/DRESSINGS) ×4 IMPLANT
DRSG OPSITE POSTOP 4X10 (GAUZE/BANDAGES/DRESSINGS) IMPLANT
DRSG OPSITE POSTOP 4X6 (GAUZE/BANDAGES/DRESSINGS) IMPLANT
DRSG OPSITE POSTOP 4X8 (GAUZE/BANDAGES/DRESSINGS) IMPLANT
ELECT PENCIL ROCKER SW 15FT (MISCELLANEOUS) ×8 IMPLANT
ELECT REM PT RETURN 9FT ADLT (ELECTROSURGICAL) ×4
ELECTRODE REM PT RTRN 9FT ADLT (ELECTROSURGICAL) ×2 IMPLANT
EVACUATOR SILICONE 100CC (DRAIN) IMPLANT
GAUZE SPONGE 4X4 12PLY STRL (GAUZE/BANDAGES/DRESSINGS) IMPLANT
GLOVE BIO SURGEON STRL SZ 6.5 (GLOVE) ×6 IMPLANT
GLOVE BIO SURGEONS STRL SZ 6.5 (GLOVE) ×2
GLOVE BIOGEL PI IND STRL 6.5 (GLOVE) ×8 IMPLANT
GLOVE BIOGEL PI IND STRL 7.0 (GLOVE) ×10 IMPLANT
GLOVE BIOGEL PI INDICATOR 6.5 (GLOVE) ×8
GLOVE BIOGEL PI INDICATOR 7.0 (GLOVE) ×10
GLOVE SURG SS PI 6.5 STRL IVOR (GLOVE) ×16 IMPLANT
GOWN SPEC L3 XXLG W/TWL (GOWN DISPOSABLE) ×8 IMPLANT
GOWN STRL REUS W/TWL XL LVL3 (GOWN DISPOSABLE) ×32 IMPLANT
KIT BASIN OR (CUSTOM PROCEDURE TRAY) ×4 IMPLANT
LEGGING LITHOTOMY PAIR STRL (DRAPES) IMPLANT
LIGASURE IMPACT 36 18CM CVD LR (INSTRUMENTS) IMPLANT
PACK GENERAL/GYN (CUSTOM PROCEDURE TRAY) ×4 IMPLANT
RELOAD PROXIMATE 75MM BLUE (ENDOMECHANICALS) ×4 IMPLANT
RTRCTR WOUND ALEXIS 18CM MED (MISCELLANEOUS) ×4
SCISSORS LAP 5X35 DISP (ENDOMECHANICALS) ×4 IMPLANT
SEALER TISSUE G2 CVD JAW 35 (ENDOMECHANICALS) ×2 IMPLANT
SEALER TISSUE G2 CVD JAW 45CM (ENDOMECHANICALS) ×2
SEALER TISSUE G2 STRG ARTC 35C (ENDOMECHANICALS) IMPLANT
SET IRRIG TUBING LAPAROSCOPIC (IRRIGATION / IRRIGATOR) ×4 IMPLANT
SLEEVE XCEL OPT CAN 5 100 (ENDOMECHANICALS) ×4 IMPLANT
SOLUTION ANTI FOG 6CC (MISCELLANEOUS) ×4 IMPLANT
SPONGE LAP 18X18 X RAY DECT (DISPOSABLE) IMPLANT
STAPLER GUN LINEAR PROX 60 (STAPLE) ×4 IMPLANT
STAPLER PROXIMATE 75MM BLUE (STAPLE) ×12 IMPLANT
STAPLER VISISTAT 35W (STAPLE) ×4 IMPLANT
SUCTION POOLE TIP (SUCTIONS) ×4 IMPLANT
SUT ETHILON 2 0 PS N (SUTURE) IMPLANT
SUT NOVA 1 T20/GS 25DT (SUTURE) IMPLANT
SUT NOVA NAB DX-16 0-1 5-0 T12 (SUTURE) IMPLANT
SUT NOVA NAB GS-21 0 18 T12 DT (SUTURE) ×8 IMPLANT
SUT PDS AB 1 CTX 36 (SUTURE) IMPLANT
SUT PDS AB 1 TP1 96 (SUTURE) IMPLANT
SUT PROLENE 2 0 KS (SUTURE) IMPLANT
SUT SILK 2 0 (SUTURE) ×2
SUT SILK 2 0 SH CR/8 (SUTURE) ×4 IMPLANT
SUT SILK 2-0 18XBRD TIE 12 (SUTURE) ×2 IMPLANT
SUT SILK 3 0 (SUTURE) ×2
SUT SILK 3 0 SH CR/8 (SUTURE) ×4 IMPLANT
SUT SILK 3-0 18XBRD TIE 12 (SUTURE) ×2 IMPLANT
SUT VIC AB 2-0 SH 18 (SUTURE) ×4 IMPLANT
SUT VIC AB 4-0 PS2 18 (SUTURE) IMPLANT
SUT VIC AB 4-0 PS2 27 (SUTURE) ×8 IMPLANT
SUT VICRYL 2 0 18  UND BR (SUTURE)
SUT VICRYL 2 0 18 UND BR (SUTURE) IMPLANT
SYR BULB IRRIGATION 50ML (SYRINGE) IMPLANT
SYS LAPSCP GELPORT 120MM (MISCELLANEOUS)
SYSTEM LAPSCP GELPORT 120MM (MISCELLANEOUS) IMPLANT
TOWEL OR 17X26 10 PK STRL BLUE (TOWEL DISPOSABLE) ×8 IMPLANT
TOWEL OR NON WOVEN STRL DISP B (DISPOSABLE) ×8 IMPLANT
TRAY FOLEY CATH 14FRSI W/METER (CATHETERS) ×4 IMPLANT
TRAY LAP CHOLE (CUSTOM PROCEDURE TRAY) ×4 IMPLANT
TROCAR BLADELESS OPT 5 100 (ENDOMECHANICALS) ×4 IMPLANT
TROCAR XCEL 12X100 BLDLESS (ENDOMECHANICALS) IMPLANT
TROCAR XCEL BLUNT TIP 100MML (ENDOMECHANICALS) ×4 IMPLANT
TROCAR XCEL NON-BLD 11X100MML (ENDOMECHANICALS) IMPLANT
TUBING FILTER THERMOFLATOR (ELECTROSURGICAL) ×4 IMPLANT
TUBING INSUFFLATION 10FT LAP (TUBING) ×4 IMPLANT
YANKAUER SUCT BULB TIP 10FT TU (MISCELLANEOUS) ×8 IMPLANT

## 2014-06-02 NOTE — H&P (Signed)
Deborah Zamora 05/03/2014 10:00 AM Location: [facility] Patient #: [phone] [phone] DOB: 06-28-1951 Married / Language: [name] / Race: White Female  History of Present Illness Leighton Ruff MD; 04/01/5642 11:29 AM) Patient words: Pt here for consult of precancerous polyp found on colonoscopy.  The patient is a 63 year old female who presents with a colonic polyp. The patient was referred by a gastroenterologist (Dr Paulita Fujita). Polyp location is in the cecum. Current diagnosis was determined by colonoscopy. Symptoms do not include rectal bleeding, diarrhea, constipation, change in bowel habits or abdominal pain. Pertinent family history does not include colorectal cancer.  Additional reasons for visit:  anal polyp is described as the following: this was found on colonoscopy. 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 Patient denies any symptoms. She denies any bleeding or pain with bowel movements. She has a history of hemorrhoid problems in the past but has not had any symptoms in a long time.  Other Problems Lenoria Farrier; 05/03/2014 10:01 AM) Anxiety Disorder Arthritis Cancer Hemorrhoids Other disease, cancer, significant illness  Past Surgical History Lenoria Farrier; 05/03/2014 10:01 AM) Appendectomy Cataract Surgery Bilateral. Foot Surgery Left. Oral Surgery Tonsillectomy  Diagnostic Studies History Lenoria Farrier; 05/03/2014 10:01 AM) Mammogram within last year Pap Smear 1-5 years ago  Allergies Apolonio Schneiders McMillen; 05/03/2014 10:07 AM) Penicillins  Medication History Apolonio Schneiders McMillen; 05/03/2014 10:11 AM) Vitamin C (1000MG  Tablet, Oral daily) Active. Calcium Carbonate (600MG  Tablet, Oral daily) Active. Magnesium Gluconate (30MG  Tablet, Oral daily) Active. Valtrex (500MG  Tablet, Oral as needed) Active.  Pregnancy / Birth History Lenoria Farrier; 05/03/2014 10:01 AM) Age at menarche 59 years. Age of menopause 44-55 Gravida  1 Maternal age 73-25 Para 0  Review of Systems Lenoria Farrier; 05/03/2014 10:01 AM) General Present- Night Sweats. Not Present- Appetite Loss, Chills, Fatigue, Fever, Weight Gain and Weight Loss. Skin Not Present- Change in Wart/Mole, Dryness, Hives, Jaundice, New Lesions, Non-Healing Wounds, Rash and Ulcer. HEENT Present- Wears glasses/contact lenses. Not Present- Earache, Hearing Loss, Hoarseness, Nose Bleed, Oral Ulcers, Ringing in the Ears, Seasonal Allergies, Sinus Pain, Sore Throat, Visual Disturbances and Yellow Eyes. Respiratory Present- Snoring. Not Present- Bloody sputum, Chronic Cough, Difficulty Breathing and Wheezing. Breast Not Present- Breast Mass, Breast Pain, Nipple Discharge and Skin Changes. Cardiovascular Present- Leg Cramps. Not Present- Chest Pain, Difficulty Breathing Lying Down, Palpitations, Rapid Heart Rate, Shortness of Breath and Swelling of Extremities. Gastrointestinal Not Present- Abdominal Pain, Bloating, Bloody Stool, Change in Bowel Habits, Chronic diarrhea, Constipation, Difficulty Swallowing, Excessive gas, Gets full quickly at meals, Hemorrhoids, Indigestion, Nausea, Rectal Pain and Vomiting. Female Genitourinary Not Present- Frequency, Nocturia, Painful Urination, Pelvic Pain and Urgency. Musculoskeletal Present- Joint Pain and Joint Stiffness. Not Present- Back Pain, Muscle Pain, Muscle Weakness and Swelling of Extremities. Neurological Present- Tingling and Tremor. Not Present- Decreased Memory, Fainting, Headaches, Numbness, Seizures, Trouble walking and Weakness. Psychiatric Not Present- Anxiety, Bipolar, Change in Sleep Pattern, Depression, Fearful and Frequent crying. Endocrine Present- Hot flashes. Not Present- Cold Intolerance, Excessive Hunger, Hair Changes, Heat Intolerance and New Diabetes. Hematology Not Present- Easy Bruising, Excessive bleeding, Gland problems, HIV and Persistent Infections.   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 Impression: We discussed surgical resection of her cecal polyp. This would most likely be an ileocecectomy, unless there was signs of frank cancer on exam. If this was the case, I have recommended a right hemicolectomy. We will also do an anal exam under anesthesia with possible excision of anal polyp depending on how this appears on exam. The surgery and anatomy were described to the patient as well as the risks of surgery and the possible complications. These include: Bleeding, deep abdominal infections and possible wound complications such as hernia and infection, damage to adjacent structures, leak of surgical connections, which can lead to other surgeries and possibly an ostomy, possible need for other procedures, such as abscess drains in radiology, possible prolonged hospital stay, possible diarrhea from removal of part of the colon, possible bladder or sexual dysfunction if having rectal surgery, possible constipation from narcotics, prolonged fatigue/weakness or appetite loss, possible early recurrence of of disease, possible complications of their medical problems such as heart disease or arrhythmias or lung problems, death (less than 1%). I believe the patient understands and wishes to proceed with the surgery. Current Plans  Pt Education - [facility] Bowel Prep Started Flagyl 500MG , 2 (two) Tablet SEE NOTE, #6, 05/03/2014, No Refill. Local Order: Take at 2pm, 3pm, and 10pm the day prior to your colon operation Started Neomycin Sulfate 500MG , 2 (two) Tablet SEE NOTE, #6, 05/03/2014, No Refill. Local Order: TAKE TWO TABLETS AT 2 PM, 3 PM, AND 10 PM THE  DAY PRIOR TO SURGERY   Signed by Leighton Ruff, MD (02/27/9448 11:29 AM)

## 2014-06-02 NOTE — Progress Notes (Signed)
Absolutely denies she has ever had an allergy to CHG

## 2014-06-02 NOTE — Op Note (Addendum)
06/02/2014  12:28 PM  PATIENT:  Deborah Zamora  63 y.o. female  Patient Care Team: Lynne Logan, MD as PCP - General (Family Medicine)  PRE-OPERATIVE DIAGNOSIS:  anal polyp, cecal polyp  POST-OPERATIVE DIAGNOSIS:  anal polyp, cecal polyp  PROCEDURE:  LAPAROSCOPIC ASSISTED ILEOCECECTOMY ANAL EXAM UNDER ANESTHESIA  SURGEON:  Surgeon(s): Leighton Ruff, MD  ASSISTANT: RNFA    ANESTHESIA:   local and general  EBL:  Total I/O In: 2000 [I.V.:2000] Out: 225 [Urine:200; Blood:25]  SPECIMEN:  Source of Specimen:  ileocecectomy  DISPOSITION OF SPECIMEN:  PATHOLOGY  COUNTS:  YES  PLAN OF CARE: Admit to inpatient   PATIENT DISPOSITION:  PACU - hemodynamically stable.   INDICATIONS: This is a 63 y.o. female who presented to my office with a SSA found on screening colonoscopy by Dr Paulita Fujita. The risk and benefits and alternative treatments were explained to the patient prior to the OR and the patient has elected to proceed with lap ileocecectomy.  Consent was signed and placed on chart prior to the OR.   OR FINDINGS: polyp noted next to IC valve  DESCRIPTION:  The patient was identified & brought into the operating room. The patient was positioned supine with left arm tucked . SCDs were active during the entire case. The patient underwent general anesthesia without any difficulty. A foley catheter was inserted under sterile conditions. A surgical time out confirmed our plan.  A digital rectal exam revealed a mass posteriorly.  A speculum was inserted into the anal canal.  The entire anal canal was visualized.  There was a hypertrophied anal polyp noted at posterior midline.  This appeared benign.  Per patient's wished this was not excised.    The abdomen was then prepped and draped in a sterile fashion. A Surgical Timeout confirmed our plan once again.  I made a vertical incision through the inferior umbilical fold. I dissected down through the subcutaneous tissues using blunt dissection  and elevated the fascia with 2 Kocher clamps.  The fascia was incised with a scalpel.  Blunt dissection was used to obtain peritoneal entry. I placed a stay suture and then the [facility] port. We induced carbon dioxide insufflation. Camera inspection revealed no injury. I placed additional ports under direct laparoscopic visualization.  I evaluated the entire abdomen laparoscopically.  The liver appeared normal, the large and small bowel were normal as well.  There were no signs of metastatic disease. I mobilized the terminal ileum.  I took care to avoid injuring any retroperitoneal structures. The right ureter was identified and preserved.  After this I began to mobilize laterally down the white line of Toldt to the hepatic flexure using the Enseal device. At that point, I made an enlargement of my umbilical incision using a scalpel. The fascia was then incised using electrocautery. An alexis wound protector was placed. The terminal ileum and right colon were then removed from the wound. The terminal ileum was transected using a GIA blue load stapler. The remaining mesentery was divided using the Enseal device. I identified a portion of the ascending colon just distal to the cecum. This was transected using another blue load GIA stapler. I opened the specimen on the back table and confirmed the polyp was inside.  An anastomosis was created between the terminal ileum and the transverse colon. This was done using a GIA blue load stapler.  The common enterotomy channel was closed using a TA 60 blue load stapler. I closed the mesenteric defect with a running  2-0 silk suture.  Hemostasis was good at the staple line. Several 3-0 silk sutures were used to imbricate the edge of the anastomosis. An anti-tension suture was placed in the crotch of the anastomosis. This was then placed back into the abdomen. The abdomen was then irrigated with normal saline. The omentum was then brought down over the anastomosis. The abdomen was  irrigated with normal saline.  The Alexis wound protector was removed, and we switched to clean instruments, gowns and drapes.  The fascia was then closed using #0 Novafil interrupted sutures.  The subcutaneous tissue of the umbilical incision was closed using interrupted 2-0 Vicryl sutures. The skin was then closed using 4-0 Vicryl sutures. Dermabond was placed on the port sites and a sterile dressing was placed over the abdominal incision. All counts were correct per operating room staff. The patient was then awakened from anesthesia and sent to the post anesthesia care unit in stable condition.

## 2014-06-02 NOTE — Transfer of Care (Signed)
Immediate Anesthesia Transfer of Care Note  Patient: Deborah Zamora  Procedure(s) Performed: Procedure(s): LAPAROSCOPIC ASSISTED ILEOCECECTOMY (N/A) RECTAL EXAM UNDER ANESTHESIA (N/A)  Patient Location: PACU  Anesthesia Type:General  Level of Consciousness: sedated  Airway & Oxygen Therapy: Patient Spontanous Breathing and Patient connected to face mask oxygen  Post-op Assessment: Report given to PACU RN and Post -op Vital signs reviewed and stable  Post vital signs: Reviewed and stable  Complications: No apparent anesthesia complications

## 2014-06-02 NOTE — Anesthesia Preprocedure Evaluation (Addendum)
Anesthesia Evaluation  Patient identified by MRN, date of birth, ID band Patient awake    Reviewed: Allergy & Precautions, H&P , NPO status , Patient's Chart, lab work & pertinent test results  Airway Mallampati: II TM Distance: >3 FB Neck ROM: Full    Dental no notable dental hx.    Pulmonary Current Smoker,  breath sounds clear to auscultation  Pulmonary exam normal       Cardiovascular negative cardio ROS  Rhythm:Regular Rate:Normal     Neuro/Psych negative neurological ROS  negative psych ROS   GI/Hepatic negative GI ROS, Neg liver ROS,   Endo/Other  negative endocrine ROS  Renal/GU negative Renal ROS  negative genitourinary   Musculoskeletal  (+) Arthritis -,   Abdominal   Peds negative pediatric ROS (+)  Hematology negative hematology ROS (+)   Anesthesia Other Findings   Reproductive/Obstetrics negative OB ROS                          Anesthesia Physical Anesthesia Plan  ASA: II  Anesthesia Plan: General   Post-op Pain Management:    Induction: Intravenous  Airway Management Planned: Oral ETT  Additional Equipment:   Intra-op Plan:   Post-operative Plan: Extubation in OR  Informed Consent: I have reviewed the patients History and Physical, chart, labs and discussed the procedure including the risks, benefits and alternatives for the proposed anesthesia with the patient or authorized representative who has indicated his/her understanding and acceptance.   Dental advisory given  Plan Discussed with: CRNA  Anesthesia Plan Comments:         Anesthesia Quick Evaluation

## 2014-06-02 NOTE — Anesthesia Postprocedure Evaluation (Signed)
  Anesthesia Post-op Note  Patient: Deborah Zamora  Procedure(s) Performed: Procedure(s) (LRB): LAPAROSCOPIC ASSISTED ILEOCECECTOMY (N/A) RECTAL EXAM UNDER ANESTHESIA (N/A)  Patient Location: PACU  Anesthesia Type: General  Level of Consciousness: awake and alert   Airway and Oxygen Therapy: Patient Spontanous Breathing  Post-op Pain: mild  Post-op Assessment: Post-op Vital signs reviewed, Patient's Cardiovascular Status Stable, Respiratory Function Stable, Patent Airway and No signs of Nausea or vomiting  Last Vitals:  Filed Vitals:   06/02/14 1517  BP: 99/64  Pulse: 75  Temp: 36.6 C  Resp: 18    Post-op Vital Signs: stable   Complications: No apparent anesthesia complications

## 2014-06-03 ENCOUNTER — Encounter (HOSPITAL_COMMUNITY): Payer: Self-pay | Admitting: General Surgery

## 2014-06-03 LAB — CBC
HCT: 36.1 % (ref 36.0–46.0)
Hemoglobin: 12.3 g/dL (ref 12.0–15.0)
MCH: 33.7 pg (ref 26.0–34.0)
MCHC: 34.1 g/dL (ref 30.0–36.0)
MCV: 98.9 fL (ref 78.0–100.0)
PLATELETS: 144 10*3/uL — AB (ref 150–400)
RBC: 3.65 MIL/uL — ABNORMAL LOW (ref 3.87–5.11)
RDW: 13.6 % (ref 11.5–15.5)
WBC: 12.1 10*3/uL — ABNORMAL HIGH (ref 4.0–10.5)

## 2014-06-03 LAB — BASIC METABOLIC PANEL
ANION GAP: 11 (ref 5–15)
BUN: 5 mg/dL — AB (ref 6–23)
CO2: 26 mEq/L (ref 19–32)
CREATININE: 0.7 mg/dL (ref 0.50–1.10)
Calcium: 8.6 mg/dL (ref 8.4–10.5)
Chloride: 104 mEq/L (ref 96–112)
GFR, EST NON AFRICAN AMERICAN: 89 mL/min — AB (ref >90)
Glucose, Bld: 103 mg/dL — ABNORMAL HIGH (ref 70–99)
Potassium: 4 mEq/L (ref 3.7–5.3)
Sodium: 141 mEq/L (ref 137–147)

## 2014-06-03 MED ORDER — LACTATED RINGERS IV SOLN
INTRAVENOUS | Status: DC
  Administered 2014-06-03 (×2): via INTRAVENOUS

## 2014-06-03 MED ORDER — KCL IN DEXTROSE-NACL 20-5-0.45 MEQ/L-%-% IV SOLN
INTRAVENOUS | Status: DC
  Administered 2014-06-03: 09:00:00 via INTRAVENOUS
  Filled 2014-06-03 (×2): qty 1000

## 2014-06-03 NOTE — Progress Notes (Signed)
1 Day Post-Op lap ileocecectomy  Subjective: Doing well, no nausea, foley out  Objective: Vital signs in last 24 hours: Temp:  [96 F (35.6 C)-98.5 F (36.9 C)] 98.1 F (36.7 C) (10/09 0539) Pulse Rate:  [71-97] 76 (10/09 0539) Resp:  [13-21] 16 (10/09 0539) BP: (97-138)/(51-78) 106/57 mmHg (10/09 0539) SpO2:  [95 %-100 %] 97 % (10/09 0539) Weight:  [104 lb 12 oz (47.514 kg)] 104 lb 12 oz (47.514 kg) (10/08 0931)   Intake/Output from previous day: 10/08 0701 - 10/09 0700 In: 4500 [I.V.:4500] Out: 1315 [Urine:1290; Blood:25] Intake/Output this shift:     General appearance: alert and cooperative GI: soft, appropriately tender, non-distended  Incision: no significant drainage  Lab Results:   Recent Labs  06/03/14 0518  WBC 12.1*  HGB 12.3  HCT 36.1  PLT 144*   BMET  Recent Labs  06/03/14 0518  NA 141  K 4.0  CL 104  CO2 26  GLUCOSE 103*  BUN 5*  CREATININE 0.70  CALCIUM 8.6   PT/INR No results found for this basename: LABPROT, INR,  in the last 72 hours ABG No results found for this basename: PHART, PCO2, PO2, HCO3,  in the last 72 hours  MEDS, Scheduled . acetaminophen  1,000 mg Oral 4 times per day  . alvimopan  12 mg Oral BID  . enoxaparin (LOVENOX) injection  40 mg Subcutaneous Q24H    Studies/Results: No results found.  Assessment: s/p Procedure(s): LAPAROSCOPIC ASSISTED ILEOCECECTOMY RECTAL EXAM UNDER ANESTHESIA Patient Active Problem List   Diagnosis Date Noted  . Colon polyp 06/02/2014    Expected post op course  Plan: d/c foley Advance diet to clears Ambulate Decrease IVF's   LOS: 1 day     .[name], MD [facility], [place]   06/03/2014 7:25 AM

## 2014-06-04 ENCOUNTER — Telehealth (INDEPENDENT_AMBULATORY_CARE_PROVIDER_SITE_OTHER): Payer: Self-pay | Admitting: General Surgery

## 2014-06-04 LAB — BASIC METABOLIC PANEL
Anion gap: 9 (ref 5–15)
BUN: 4 mg/dL — ABNORMAL LOW (ref 6–23)
CALCIUM: 8.5 mg/dL (ref 8.4–10.5)
CO2: 28 mEq/L (ref 19–32)
Chloride: 104 mEq/L (ref 96–112)
Creatinine, Ser: 0.73 mg/dL (ref 0.50–1.10)
GFR calc non Af Amer: 88 mL/min — ABNORMAL LOW (ref >90)
Glucose, Bld: 98 mg/dL (ref 70–99)
POTASSIUM: 4 meq/L (ref 3.7–5.3)
SODIUM: 141 meq/L (ref 137–147)

## 2014-06-04 LAB — CBC
HCT: 35.1 % — ABNORMAL LOW (ref 36.0–46.0)
Hemoglobin: 12 g/dL (ref 12.0–15.0)
MCH: 34 pg (ref 26.0–34.0)
MCHC: 34.2 g/dL (ref 30.0–36.0)
MCV: 99.4 fL (ref 78.0–100.0)
Platelets: 143 10*3/uL — ABNORMAL LOW (ref 150–400)
RBC: 3.53 MIL/uL — AB (ref 3.87–5.11)
RDW: 13.7 % (ref 11.5–15.5)
WBC: 9.2 10*3/uL (ref 4.0–10.5)

## 2014-06-04 NOTE — Progress Notes (Signed)
2 Days Post-Op lap ileocecectomy  Subjective: Doing well, no nausea, good urination, passing flatus  Objective: Vital signs in last 24 hours: Temp:  [98.2 F (36.8 C)-99.6 F (37.6 C)] 99.6 F (37.6 C) (10/10 0500) Pulse Rate:  [78-88] 85 (10/10 0500) Resp:  [16-18] 16 (10/10 0500) BP: (104-122)/(53-69) 122/69 mmHg (10/10 0500) SpO2:  [96 %-97 %] 96 % (10/10 0500)   Intake/Output from previous day: 10/09 0701 - 10/10 0700 In: 1181.7 [P.O.:120; I.V.:1061.7] Out: 1800 [Urine:1800] Intake/Output this shift:     General appearance: alert and cooperative GI: soft, appropriately tender, non-distended  Incision: no significant drainage  Lab Results:   Recent Labs  06/03/14 0518 06/04/14 0450  WBC 12.1* 9.2  HGB 12.3 12.0  HCT 36.1 35.1*  PLT 144* 143*   BMET  Recent Labs  06/03/14 0518 06/04/14 0450  NA 141 141  K 4.0 4.0  CL 104 104  CO2 26 28  GLUCOSE 103* 98  BUN 5* 4*  CREATININE 0.70 0.73  CALCIUM 8.6 8.5   PT/INR No results found for this basename: LABPROT, INR,  in the last 72 hours ABG No results found for this basename: PHART, PCO2, PO2, HCO3,  in the last 72 hours  MEDS, Scheduled . alvimopan  12 mg Oral BID  . enoxaparin (LOVENOX) injection  40 mg Subcutaneous Q24H    Studies/Results: No results found.  Assessment: s/p Procedure(s): LAPAROSCOPIC ASSISTED ILEOCECECTOMY RECTAL EXAM UNDER ANESTHESIA Patient Active Problem List   Diagnosis Date Noted  . Colon polyp 06/02/2014    Expected post op course  Plan: Poss d/c in AM Advance diet to soft diet Ambulate SL IVF's   LOS: 2 days     .Deborah Adie, MD [facility], [place]   06/04/2014 8:23 AM

## 2014-06-04 NOTE — Discharge Instructions (Signed)

## 2014-06-04 NOTE — Telephone Encounter (Signed)
Opened in error

## 2014-06-05 LAB — BASIC METABOLIC PANEL
ANION GAP: 11 (ref 5–15)
BUN: 3 mg/dL — ABNORMAL LOW (ref 6–23)
CALCIUM: 8.2 mg/dL — AB (ref 8.4–10.5)
CHLORIDE: 102 meq/L (ref 96–112)
CO2: 26 meq/L (ref 19–32)
CREATININE: 0.68 mg/dL (ref 0.50–1.10)
GFR calc non Af Amer: 90 mL/min — ABNORMAL LOW (ref >90)
Glucose, Bld: 80 mg/dL (ref 70–99)
Potassium: 3.8 mEq/L (ref 3.7–5.3)
SODIUM: 139 meq/L (ref 137–147)

## 2014-06-05 LAB — CBC
HEMATOCRIT: 35.9 % — AB (ref 36.0–46.0)
Hemoglobin: 12.4 g/dL (ref 12.0–15.0)
MCH: 34.1 pg — ABNORMAL HIGH (ref 26.0–34.0)
MCHC: 34.5 g/dL (ref 30.0–36.0)
MCV: 98.6 fL (ref 78.0–100.0)
Platelets: 137 10*3/uL — ABNORMAL LOW (ref 150–400)
RBC: 3.64 MIL/uL — ABNORMAL LOW (ref 3.87–5.11)
RDW: 13.4 % (ref 11.5–15.5)
WBC: 6.7 10*3/uL (ref 4.0–10.5)

## 2014-06-05 MED ORDER — HYDROCODONE-ACETAMINOPHEN 5-325 MG PO TABS: 1.0000 | ORAL_TABLET | ORAL | Status: AC | PRN

## 2014-06-05 NOTE — Discharge Summary (Signed)
Physician Discharge Summary  Patient ID: Deborah Zamora MRN: [id number] DOB/AGE: November 18, 1950 63 y.o.  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  She did well post op and had bowel function by POD 2.  By POD 3 she was tolerating a soft diet and had minimal pain.  She was in stable condition for discharge to home.    Consults: None  Significant Diagnostic Studies: labs: cbc, chemistry  Treatments: IV hydration, analgesia: acetaminophen w/ codeine and surgery: see above  Discharge Exam: Blood pressure 116/60, pulse 73, temperature 98.4 F (36.9 C), temperature source Oral, resp. rate 16, height 5\' 3"  (1.6 m), weight 104 lb 12 oz (47.514 kg), SpO2 97.00%. General appearance: alert and cooperative GI: normal findings: soft, non-tender Incision/Wound: clean, dry, intact  Disposition: Home     Medication List         Calcium-Vitamin D 600-125 MG-UNIT Tabs  Take 1 tablet by mouth at bedtime.     diphenhydrAMINE 25 MG tablet  Commonly known as:  SOMINEX  Take 25 mg by mouth as needed for sleep.     HYDROcodone-acetaminophen 5-325 MG per tablet  Commonly known as:  NORCO/VICODIN  Take 1-2 tablets by mouth every 4 (four) hours as needed for moderate pain.     Magnesium 500 MG Tabs  Take 250 mg by mouth at bedtime.     naproxen sodium 220 MG tablet  Commonly known as:  ANAPROX  Take 220 mg by mouth once as needed (neck pain.).     valACYclovir 1000 MG tablet  Commonly known as:  VALTREX  Take 2,000 mg by mouth 2 (two) times daily as needed (fever blister.).     vitamin C 500 MG tablet  Commonly known as:  ASCORBIC ACID  Take 500 mg by mouth at bedtime.           Follow-up Information   Follow up with [name]., MD. Schedule an  appointment as soon as possible for a visit in 2 weeks.   Specialty:  General Surgery   Contact information:   [address] [place] [address] [phone]       Signed: [name] 67/34/1937, 8:25 AM

## 2014-06-30 DIAGNOSIS — Z23 Encounter for immunization: Secondary | ICD-10-CM | POA: Diagnosis not present

## 2014-09-02 DIAGNOSIS — L570 Actinic keratosis: Secondary | ICD-10-CM | POA: Diagnosis not present

## 2014-09-05 DIAGNOSIS — Z961 Presence of intraocular lens: Secondary | ICD-10-CM | POA: Diagnosis not present

## 2014-09-05 DIAGNOSIS — H40013 Open angle with borderline findings, low risk, bilateral: Secondary | ICD-10-CM | POA: Diagnosis not present

## 2014-09-05 DIAGNOSIS — H26493 Other secondary cataract, bilateral: Secondary | ICD-10-CM | POA: Diagnosis not present

## 2014-10-20 DIAGNOSIS — D485 Neoplasm of uncertain behavior of skin: Secondary | ICD-10-CM | POA: Diagnosis not present

## 2014-10-20 DIAGNOSIS — L570 Actinic keratosis: Secondary | ICD-10-CM | POA: Diagnosis not present

## 2014-10-20 DIAGNOSIS — L814 Other melanin hyperpigmentation: Secondary | ICD-10-CM | POA: Diagnosis not present

## 2014-10-20 DIAGNOSIS — D1801 Hemangioma of skin and subcutaneous tissue: Secondary | ICD-10-CM | POA: Diagnosis not present

## 2014-10-20 DIAGNOSIS — L821 Other seborrheic keratosis: Secondary | ICD-10-CM | POA: Diagnosis not present

## 2014-10-20 DIAGNOSIS — Z85828 Personal history of other malignant neoplasm of skin: Secondary | ICD-10-CM | POA: Diagnosis not present

## 2014-10-21 DIAGNOSIS — D225 Melanocytic nevi of trunk: Secondary | ICD-10-CM | POA: Diagnosis not present

## 2015-03-28 DIAGNOSIS — R636 Underweight: Secondary | ICD-10-CM | POA: Diagnosis not present

## 2015-03-28 DIAGNOSIS — Z1231 Encounter for screening mammogram for malignant neoplasm of breast: Secondary | ICD-10-CM | POA: Diagnosis not present

## 2015-03-28 DIAGNOSIS — E2839 Other primary ovarian failure: Secondary | ICD-10-CM | POA: Diagnosis not present

## 2015-03-28 DIAGNOSIS — N959 Unspecified menopausal and perimenopausal disorder: Secondary | ICD-10-CM | POA: Diagnosis not present

## 2015-04-06 ENCOUNTER — Other Ambulatory Visit: Payer: Self-pay | Admitting: Obstetrics and Gynecology

## 2015-04-06 DIAGNOSIS — R5381 Other malaise: Secondary | ICD-10-CM

## 2015-04-07 ENCOUNTER — Other Ambulatory Visit: Payer: Self-pay | Admitting: Obstetrics and Gynecology

## 2015-04-07 DIAGNOSIS — E2839 Other primary ovarian failure: Secondary | ICD-10-CM

## 2015-04-11 DIAGNOSIS — F17210 Nicotine dependence, cigarettes, uncomplicated: Secondary | ICD-10-CM | POA: Diagnosis not present

## 2015-04-11 DIAGNOSIS — Z0000 Encounter for general adult medical examination without abnormal findings: Secondary | ICD-10-CM | POA: Diagnosis not present

## 2015-04-11 DIAGNOSIS — B009 Herpesviral infection, unspecified: Secondary | ICD-10-CM | POA: Diagnosis not present

## 2015-04-11 DIAGNOSIS — Z1389 Encounter for screening for other disorder: Secondary | ICD-10-CM | POA: Diagnosis not present

## 2015-05-23 DIAGNOSIS — Z23 Encounter for immunization: Secondary | ICD-10-CM | POA: Diagnosis not present

## 2015-06-28 DIAGNOSIS — H26491 Other secondary cataract, right eye: Secondary | ICD-10-CM | POA: Diagnosis not present

## 2015-06-28 DIAGNOSIS — H26492 Other secondary cataract, left eye: Secondary | ICD-10-CM | POA: Diagnosis not present

## 2015-07-10 DIAGNOSIS — H26492 Other secondary cataract, left eye: Secondary | ICD-10-CM | POA: Diagnosis not present

## 2015-07-10 DIAGNOSIS — Z961 Presence of intraocular lens: Secondary | ICD-10-CM | POA: Diagnosis not present

## 2015-07-10 DIAGNOSIS — H26493 Other secondary cataract, bilateral: Secondary | ICD-10-CM | POA: Diagnosis not present

## 2015-07-10 DIAGNOSIS — H00029 Hordeolum internum unspecified eye, unspecified eyelid: Secondary | ICD-10-CM | POA: Diagnosis not present

## 2015-07-10 DIAGNOSIS — H02839 Dermatochalasis of unspecified eye, unspecified eyelid: Secondary | ICD-10-CM | POA: Diagnosis not present

## 2015-07-17 DIAGNOSIS — H26492 Other secondary cataract, left eye: Secondary | ICD-10-CM | POA: Diagnosis not present

## 2015-07-31 DIAGNOSIS — H26491 Other secondary cataract, right eye: Secondary | ICD-10-CM | POA: Diagnosis not present

## 2015-08-08 DIAGNOSIS — H26491 Other secondary cataract, right eye: Secondary | ICD-10-CM | POA: Diagnosis not present

## 2015-09-25 ENCOUNTER — Other Ambulatory Visit: Payer: Self-pay | Admitting: Obstetrics and Gynecology

## 2015-09-25 DIAGNOSIS — E2839 Other primary ovarian failure: Secondary | ICD-10-CM

## 2015-09-25 DIAGNOSIS — N959 Unspecified menopausal and perimenopausal disorder: Secondary | ICD-10-CM

## 2015-09-27 ENCOUNTER — Ambulatory Visit
Admission: RE | Admit: 2015-09-27 | Discharge: 2015-09-27 | Disposition: A | Discharge status: Home / Self Care | Payer: Medicare Other | Source: Ambulatory Visit | Attending: Obstetrics and Gynecology | Admitting: Obstetrics and Gynecology

## 2015-09-27 DIAGNOSIS — E2839 Other primary ovarian failure: Secondary | ICD-10-CM

## 2015-09-27 DIAGNOSIS — M85851 Other specified disorders of bone density and structure, right thigh: Secondary | ICD-10-CM | POA: Diagnosis not present

## 2015-09-27 DIAGNOSIS — N959 Unspecified menopausal and perimenopausal disorder: Secondary | ICD-10-CM

## 2016-07-11 DIAGNOSIS — Z23 Encounter for immunization: Secondary | ICD-10-CM | POA: Diagnosis not present
